# Patient Record
Sex: Female | Born: 1995 | Race: Black or African American | Hispanic: No | Marital: Single | Smoking: Current every day smoker
Health system: Southern US, Community
[De-identification: ages and names within clinical notes are randomized; demographics above are authoritative.]

## PROBLEM LIST (undated history)

## (undated) HISTORY — PX: HERNIA REPAIR: SHX51

---

## 2018-12-20 ENCOUNTER — Emergency Department (HOSPITAL_COMMUNITY): Payer: Medicaid Other

## 2018-12-20 ENCOUNTER — Encounter (HOSPITAL_COMMUNITY): Payer: Self-pay

## 2018-12-20 ENCOUNTER — Emergency Department (HOSPITAL_COMMUNITY)
Admission: EM | Admit: 2018-12-20 | Discharge: 2018-12-20 | Disposition: A | Discharge status: Home / Self Care | Payer: Medicaid Other | Attending: Emergency Medicine | Admitting: Emergency Medicine

## 2018-12-20 ENCOUNTER — Other Ambulatory Visit: Payer: Self-pay

## 2018-12-20 DIAGNOSIS — O99331 Smoking (tobacco) complicating pregnancy, first trimester: Secondary | ICD-10-CM | POA: Diagnosis not present

## 2018-12-20 DIAGNOSIS — F17210 Nicotine dependence, cigarettes, uncomplicated: Secondary | ICD-10-CM | POA: Diagnosis not present

## 2018-12-20 DIAGNOSIS — R102 Pelvic and perineal pain: Secondary | ICD-10-CM | POA: Insufficient documentation

## 2018-12-20 DIAGNOSIS — Z3A01 Less than 8 weeks gestation of pregnancy: Secondary | ICD-10-CM | POA: Diagnosis not present

## 2018-12-20 DIAGNOSIS — O9989 Other specified diseases and conditions complicating pregnancy, childbirth and the puerperium: Secondary | ICD-10-CM | POA: Insufficient documentation

## 2018-12-20 LAB — URINALYSIS, ROUTINE W REFLEX MICROSCOPIC
Bilirubin Urine: NEGATIVE
Glucose, UA: NEGATIVE mg/dL
Hgb urine dipstick: NEGATIVE
Ketones, ur: 5 mg/dL — AB
Leukocytes,Ua: NEGATIVE
Nitrite: NEGATIVE
Protein, ur: NEGATIVE mg/dL
Specific Gravity, Urine: 1.02 (ref 1.005–1.030)
pH: 5 (ref 5.0–8.0)

## 2018-12-20 LAB — HCG, QUANTITATIVE, PREGNANCY: hCG, Beta Chain, Quant, S: 6099 m[IU]/mL — ABNORMAL HIGH (ref <5)

## 2018-12-20 NOTE — Discharge Instructions (Addendum)
You have been diagnosed today with first trimester pregnancy approximately 5 weeks and 1 day.  At this time there does not appear to be the presence of an emergent medical condition, however there is always the potential for conditions to change. Please read and follow the below instructions.  Please return to the Emergency Department immediately for any new or worsening symptoms. Please be sure to follow up with your Primary Care Provider within one week regarding your visit today; please call their office to schedule an appointment even if you are feeling better for a follow-up visit. Please call the women's clinic in [place] to establish a local OB/GYN to follow-up on your first trimester pregnancy. Please read the packaging regarding first trimester of pregnancy.  Get help right away if: You have a fever. You are leaking fluid from your vagina. You have spotting or bleeding from your vagina. You have very bad belly cramping or pain. You gain or lose weight rapidly. You throw up blood. It may look like coffee grounds. You are around people who have [place] measles, fifth disease, or chickenpox. You have a very bad headache. You have shortness of breath. You have any kind of trauma, such as from a fall or a car accident.  Please read the additional information packets attached to your discharge summary.

## 2018-12-20 NOTE — ED Triage Notes (Signed)
Pt states she had a positive pregnancy test and wanted to see how far along she is. Pt also reports some minor cramping which led to her doing an at home preg test. Pt recently moved from [place], and does not have an OBGYN in hte area.

## 2018-12-20 NOTE — ED Provider Notes (Addendum)
[facility] DEPT Provider Note   CSN: [id number] Arrival date & time: 12/20/18  1013    History   Chief Complaint Chief Complaint  Patient presents with  . Possible Pregnancy    HPI Julia Dickson is a 23 y.o. female with history of miscarriage x1 presents today for positive pregnancy test.  Patient reports that 2 days ago she had a positive pregnancy test after missing her menstrual cycle.  Patient presents today to know how far along she is in her pregnancy.  Patient reports that she has had approximately 1 week of intermittent lower abdominal cramping that is mild comes and goes and without alleviating or aggravating factors.  She denies any abdominal cramping here in the ED.  She has not any medication for this cramping.  Patient reports a cramping feels similar to previous pregnancy that resulted in miscarriage.  She denies any vaginal bleeding or fluid loss.  States that she is otherwise been feeling well.  Reports last menstrual period first week of May.    HPI  History reviewed. No pertinent past medical history.  There are no active problems to display for this patient.   Past Surgical History:  Procedure Laterality Date  . HERNIA REPAIR       OB History    Gravida  1   Para      Term      Preterm      AB      Living        SAB      TAB      Ectopic      Multiple      Live Births               Home Medications    Prior to Admission medications   Not on File    Family History No family history on file.  Social History Social History   Tobacco Use  . Smoking status: Current Every Day Smoker    Packs/day: 0.50  . Smokeless tobacco: Never Used  Substance Use Topics  . Alcohol use: Yes    Comment: socially  . Drug use: Never     Allergies   Sulfa antibiotics   Review of Systems Review of Systems  Constitutional: Negative.  Negative for chills and fever.  Gastrointestinal: Negative.   Negative for abdominal pain, diarrhea, nausea and vomiting.  Genitourinary: Positive for pelvic pain (Cramping). Negative for dysuria, hematuria, vaginal bleeding and vaginal discharge.  All other systems reviewed and are negative.  Physical Exam Updated Vital Signs BP 128/80 (BP Location: Left Arm)   Pulse 74   Temp 98.7 F (37.1 C) (Oral)   Resp 16   Ht 5\' 7"  (1.702 m)   Wt 53.8 kg   LMP 11/05/2018   SpO2 97%   BMI 18.59 kg/m   Physical Exam Constitutional:      General: She is not in acute distress.    Appearance: Normal appearance. She is well-developed. She is not ill-appearing or diaphoretic.  HENT:     Head: Normocephalic and atraumatic.     Right Ear: External ear normal.     Left Ear: External ear normal.     Nose: Nose normal.  Eyes:     General: Vision grossly intact. Gaze aligned appropriately.     Pupils: Pupils are equal, round, and reactive to light.  Neck:     Musculoskeletal: Normal range of motion.     Trachea: Trachea and phonation  normal. No tracheal deviation.  Cardiovascular:     Rate and Rhythm: Normal rate and regular rhythm.     Heart sounds: Normal heart sounds.  Pulmonary:     Effort: Pulmonary effort is normal. No respiratory distress.  Abdominal:     General: There is no distension.     Palpations: Abdomen is soft.     Tenderness: There is no abdominal tenderness. There is no guarding or rebound.  Genitourinary:    Comments: Deferred by patient Musculoskeletal: Normal range of motion.  Skin:    General: Skin is warm and dry.  Neurological:     Mental Status: She is alert.     GCS: GCS eye subscore is 4. GCS verbal subscore is 5. GCS motor subscore is 6.     Comments: Speech is clear and goal oriented, follows commands Major Cranial nerves without deficit, no facial droop Moves extremities without ataxia, coordination intact  Psychiatric:        Behavior: Behavior normal.      ED Treatments / Results  Labs (all labs ordered are  listed, but only abnormal results are displayed) Labs Reviewed  HCG, QUANTITATIVE, PREGNANCY - Abnormal; Notable for the following components:      Result Value   hCG, Beta Chain, Quant, S 6,099 (*)    All other components within normal limits  URINALYSIS, ROUTINE W REFLEX MICROSCOPIC - Abnormal; Notable for the following components:   Ketones, ur 5 (*)    All other components within normal limits    EKG None  Radiology [place] Ob Comp < 14 Wks  Result Date: 12/20/2018 CLINICAL DATA:  Cramping.  Pain. EXAM: OBSTETRIC <14 WK US AND TRANSVAGINAL OB US TECHNIQUE: Both transabdominal and transvaginal ultrasound examinations were performed for complete evaluation of the gestation as well as the maternal uterus, adnexal regions, and pelvic cul-de-sac. Transvaginal technique was performed to assess early pregnancy. COMPARISON:  None. FINDINGS: Intrauterine gestational sac: Single Yolk sac:  Not Visualized. Embryo:  Not Visualized. Cardiac Activity: Not Visualized. Heart Rate: Not visualized MSD: 4 mm   5 w   1 d Subchorionic hemorrhage:  None visualized. Maternal uterus/adnexae: There is no maternal abnormality detected on this exam. There is a trace amount of free fluid in the pelvis. IMPRESSION: A single intrauterine gestational sac is noted measuring at 5 weeks and 1 day. Probable early intrauterine gestational sac, but no yolk sac, fetal pole, or cardiac activity yet visualized. Recommend follow-up quantitative B-HCG levels and follow-up US in 14 days to assess viability. This recommendation follows SRU consensus guidelines: Diagnostic Criteria for Nonviable Pregnancy Early in the First Trimester. Malva Limes Engl J Med 2013; 161:0960-45; 369:1443-51. Electronically Signed   By: Katherine Mantlehristopher  Green M.D.   On: 12/20/2018 15:09    Procedures Procedures (including critical care time)  Medications Ordered in ED Medications - No data to display   Initial Impression / Assessment and Plan / ED Course  I have reviewed the  triage vital signs and the nursing notes.  Pertinent labs & imaging results that were available during my care of the patient were reviewed by me and considered in my medical decision making (see chart for details).    Based on last menstrual period approximately [redacted] weeks gestation, quantitative hCG of 6099 consistent.  She has had intermittent cramping will perform ultrasound to ensure intrauterine pregnancy. - Urinalysis with 5 ketones, nonacute - Ultrasound OB complete less than 14 weeks:  IMPRESSION:  A single intrauterine gestational sac is noted measuring at  5 weeks  and 1 day. Probable early intrauterine gestational sac, but no yolk  sac, fetal pole, or cardiac activity yet visualized. Recommend  follow-up quantitative B-HCG levels and follow-up US in 14 days to  assess viability. This recommendation follows SRU consensus  guidelines: Diagnostic Criteria for Nonviable Pregnancy Early in the  First Trimester. Malva Limes Engl J Med 2013; 369:1443-51.  - Patient updated on imaging today, she reports that [place] [facility]  I have given her referral to the women's clinic and encouraged her to call their offices today to schedule a follow-up appointment.  She is aware that follow-up will be needed within 2 weeks for repeat blood work and ultrasound and she states understanding.  She plans to call OB/GYN office today to schedule this appointment.  First trimester handout given.  At this time there does not appear to be any evidence of an acute emergency medical condition and the patient appears stable for discharge with appropriate outpatient follow up. Diagnosis was discussed with patient who verbalizes understanding of care plan and is agreeable to discharge. I have discussed return precautions with patient who verbalizes understanding of return precautions. Patient encouraged to follow-up with their PCP and OBGYN. All questions  answered.  Patient has been discharged in good condition.  Patient's case discussed with Dr. Estell HarpinZammit who agrees with plan to discharge with outpatient follow-up.   Note: Portions of this report may have been transcribed using voice recognition software. Every effort was made to ensure accuracy; however, inadvertent computerized transcription errors may still be present. Final Clinical Impressions(s) / ED Diagnoses   Final diagnoses:  Less than [redacted] weeks gestation of pregnancy    ED Discharge Orders    None       Elizabeth PalauMorelli, Keavon Sensing A, PA-C 12/20/18 [address]05    Keyanni Whittinghill A, PA-C 12/20/18 1625    Bethann BerkshireZammit, Joseph, MD 12/20/18 2102

## 2018-12-20 NOTE — ED Notes (Signed)
Bed: WTR6 Expected date:  Expected time:  Means of arrival:  Comments: 

## 2020-04-08 IMAGING — US OBSTETRIC <14 WK ULTRASOUND
1 series · 13 of 28 positions shown · non-contrast
Comparison: None.

CLINICAL DATA: Cramping.  Pain.

EXAM:
OBSTETRIC <14 WK US AND TRANSVAGINAL OB US
TECHNIQUE: Both transabdominal and transvaginal ultrasound examinations were
performed for complete evaluation of the gestation as well as the
maternal uterus, adnexal regions, and pelvic cul-de-sac.
Transvaginal technique was performed to assess early pregnancy.

[Series 1: obstetric <14 wk ultrasound · 32 acquisitions, 13 frames shown]
[im 2/32]
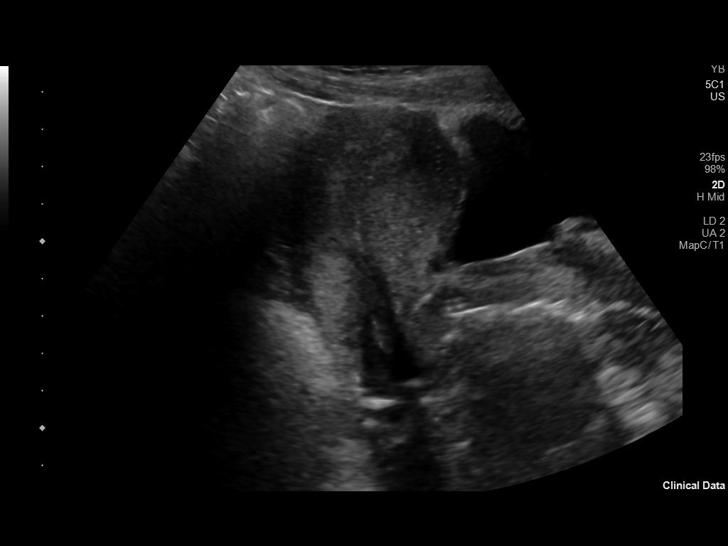
[im 4/32]
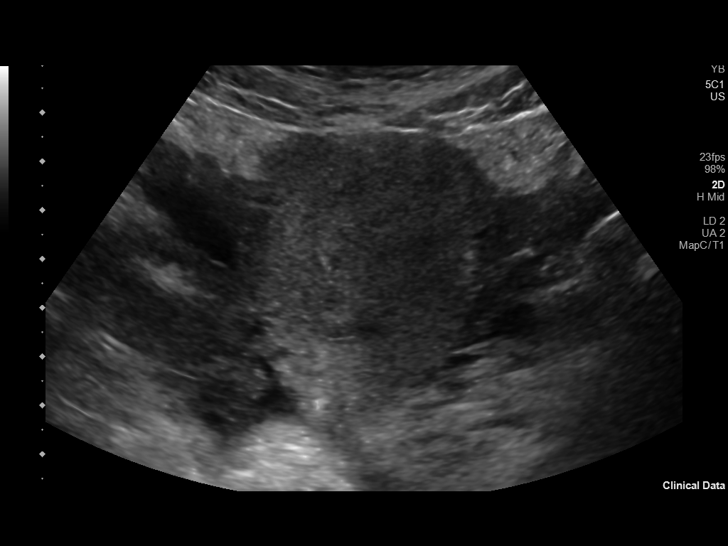
[im 6/32]
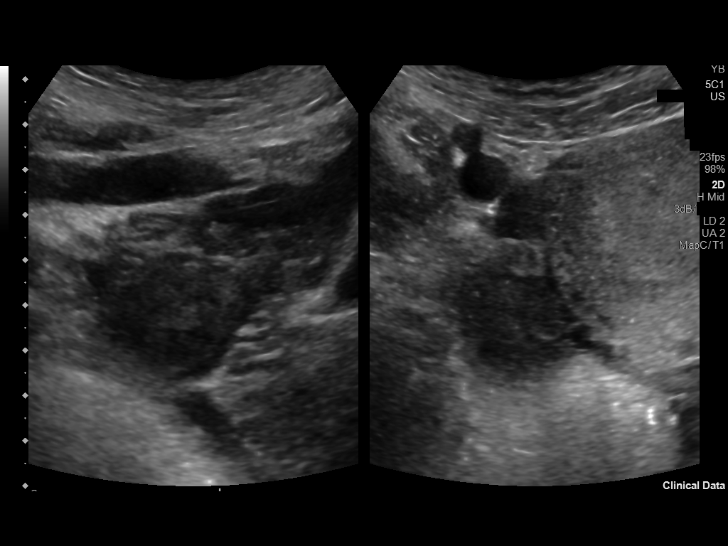
[im 9/32]
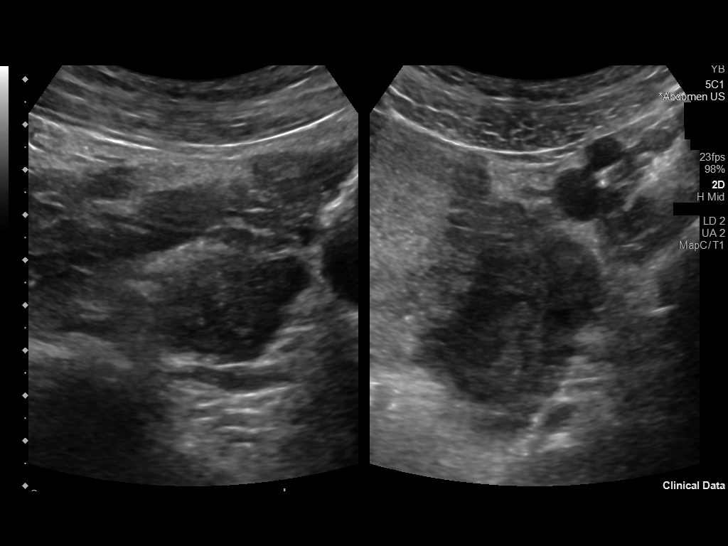
[im 11/32]
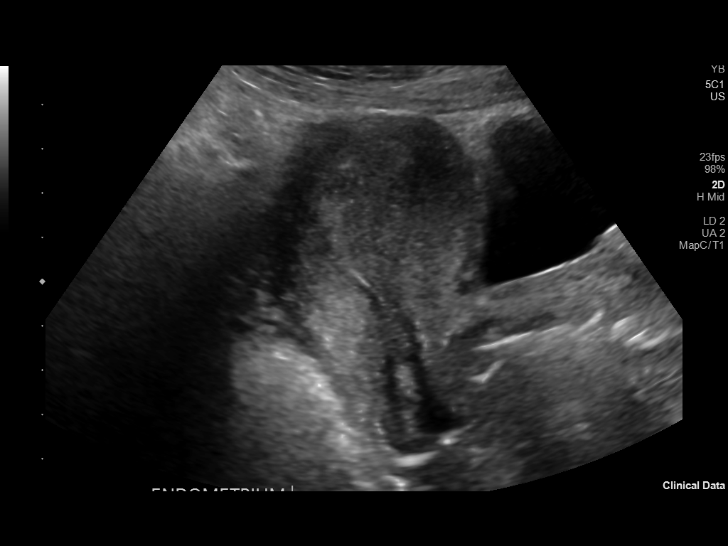
[im 13/32]
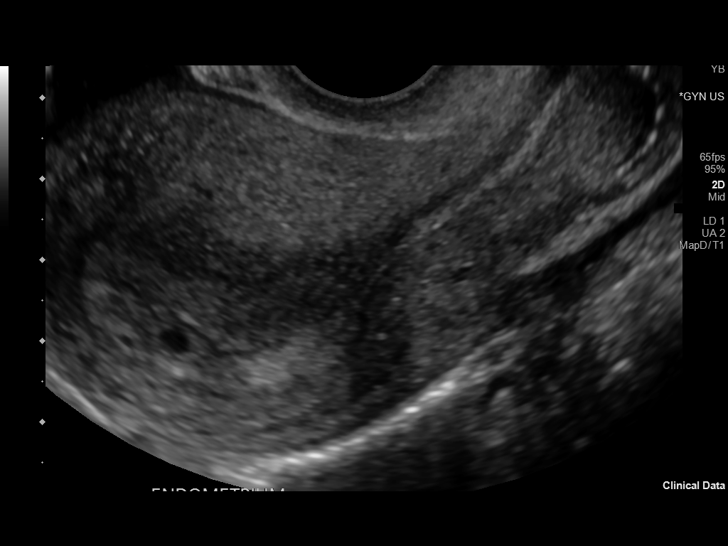
[im 17/32]
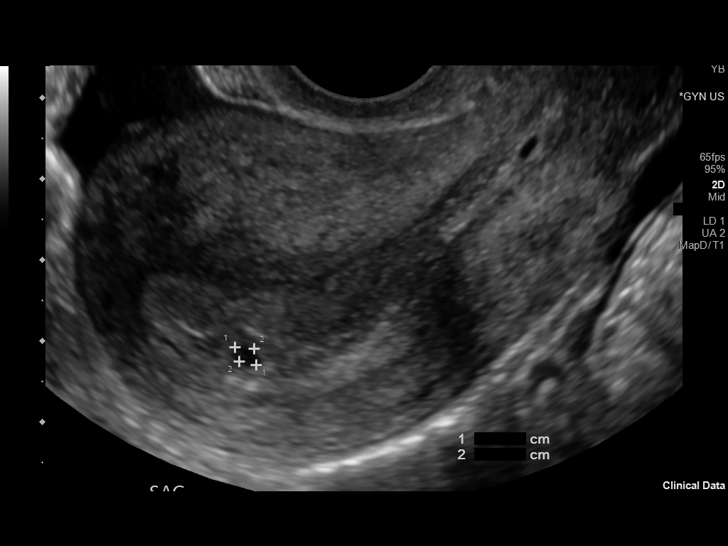
[im 19/32]
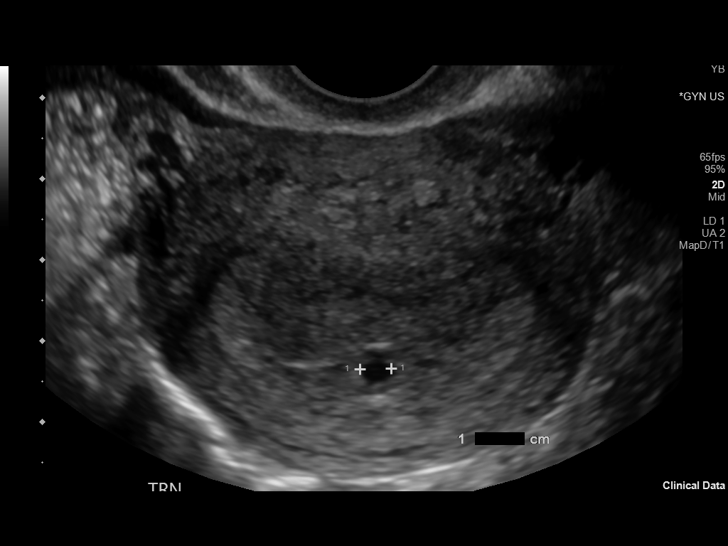
[im 21/32]
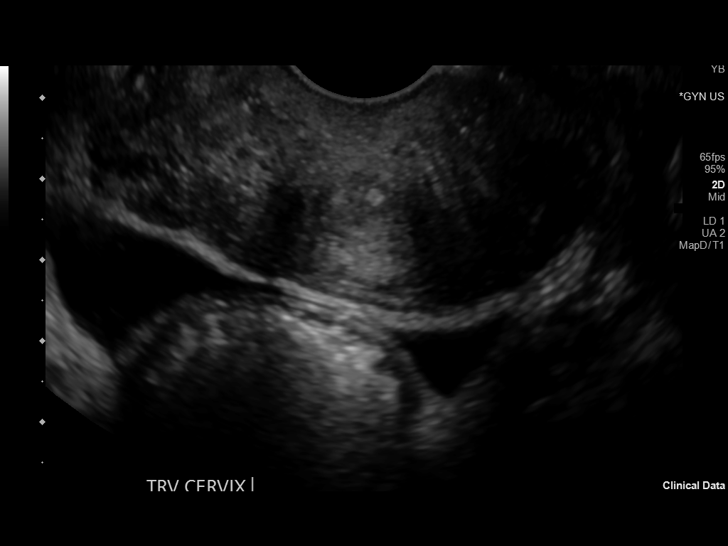
[im 23/32]
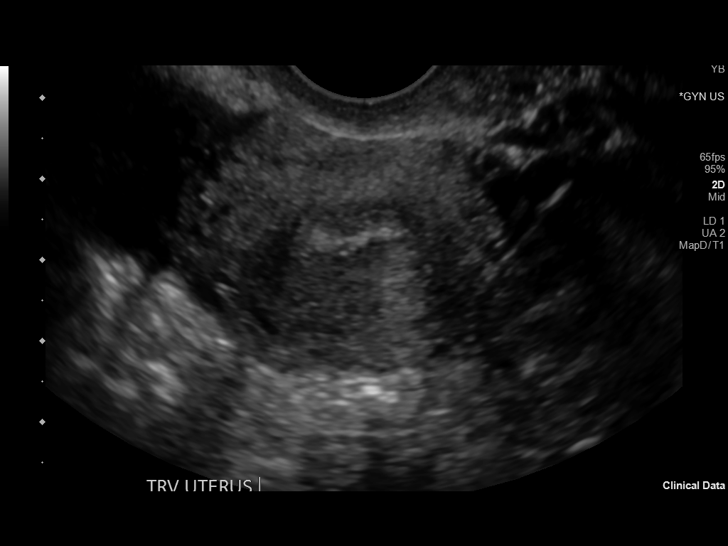
[im 26/32]
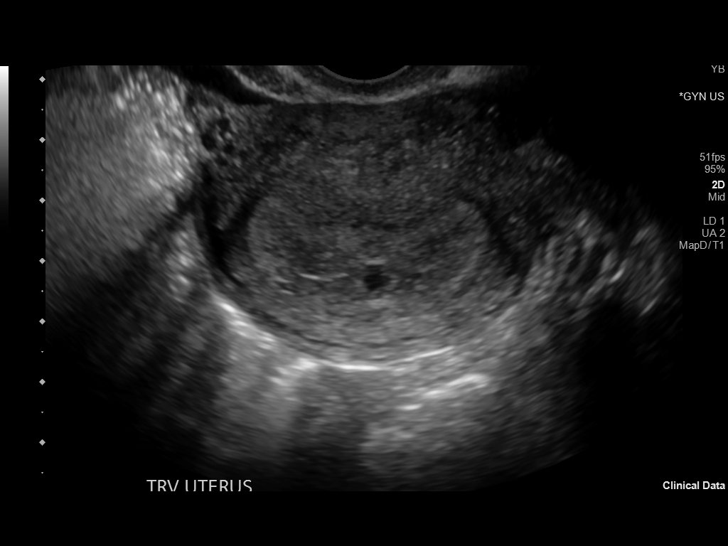
[im 28/32]
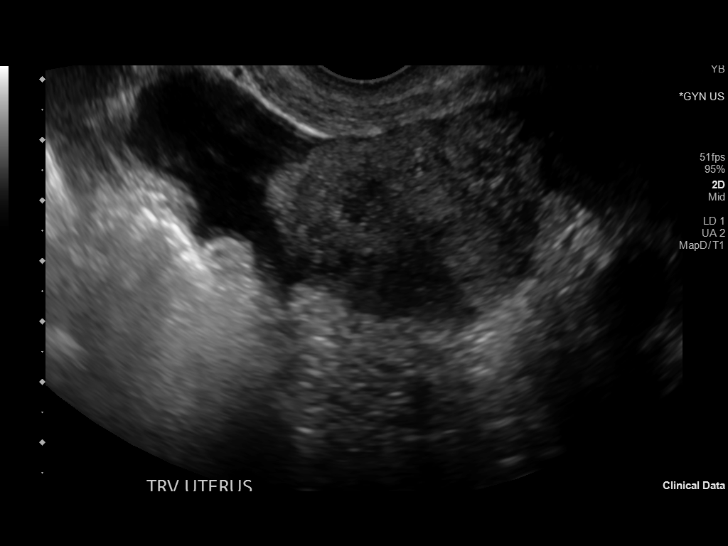
[im 30/32]
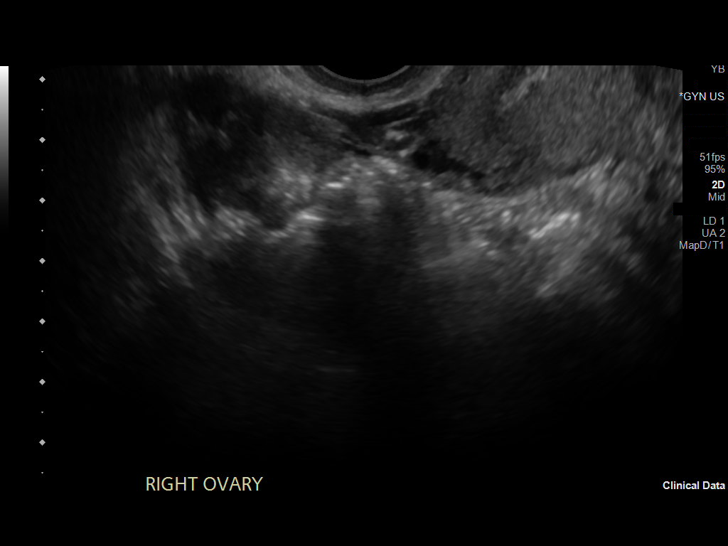

[13 of 28 positions shown; findings below may reference images not displayed]

FINDINGS: Intrauterine gestational sac: Single

Yolk sac:  Not Visualized.

Embryo:  Not Visualized.

Cardiac Activity: Not Visualized.

Heart Rate: Not visualized

MSD: 4 mm   5 w   1 d

Subchorionic hemorrhage:  None visualized.

Maternal uterus/adnexae: There is no maternal abnormality detected
on this exam. There is a trace amount of free fluid in the pelvis.
IMPRESSION: A single intrauterine gestational sac is noted measuring at 5 weeks
and 1 day. Probable early intrauterine gestational sac, but no yolk
sac, fetal pole, or cardiac activity yet visualized. Recommend
follow-up quantitative B-HCG levels and follow-up US in 14 days to
assess viability. This recommendation follows SRU consensus
guidelines: Diagnostic Criteria for Nonviable Pregnancy Early in the
First Trimester. N Engl J Med 2414; [DATE].
